# Patient Record
Sex: Male | Born: 1940 | Race: White | Hispanic: No | Marital: Married | Smoking: Never smoker
Health system: Southern US, Community
[De-identification: ages and names within clinical notes are randomized; demographics above are authoritative.]

## PROBLEM LIST (undated history)

## (undated) DIAGNOSIS — I10 Essential (primary) hypertension: Secondary | ICD-10-CM

---

## 2014-08-25 ENCOUNTER — Ambulatory Visit (HOSPITAL_COMMUNITY): Payer: BC Managed Care – PPO | Attending: Emergency Medicine

## 2014-08-25 ENCOUNTER — Encounter (HOSPITAL_COMMUNITY): Payer: Self-pay | Admitting: Emergency Medicine

## 2014-08-25 ENCOUNTER — Emergency Department (HOSPITAL_COMMUNITY)
Admission: EM | Admit: 2014-08-25 | Discharge: 2014-08-25 | Disposition: A | Discharge status: Home / Self Care | Payer: BC Managed Care – PPO | Source: Home / Self Care | Attending: Emergency Medicine | Admitting: Emergency Medicine

## 2014-08-25 DIAGNOSIS — Y9289 Other specified places as the place of occurrence of the external cause: Secondary | ICD-10-CM | POA: Insufficient documentation

## 2014-08-25 DIAGNOSIS — R0781 Pleurodynia: Secondary | ICD-10-CM | POA: Insufficient documentation

## 2014-08-25 DIAGNOSIS — R079 Chest pain, unspecified: Secondary | ICD-10-CM | POA: Diagnosis present

## 2014-08-25 DIAGNOSIS — S20212A Contusion of left front wall of thorax, initial encounter: Secondary | ICD-10-CM

## 2014-08-25 DIAGNOSIS — W1840XA Slipping, tripping and stumbling without falling, unspecified, initial encounter: Secondary | ICD-10-CM | POA: Insufficient documentation

## 2014-08-25 HISTORY — DX: Essential (primary) hypertension: I10

## 2014-08-25 MED ORDER — TRAMADOL HCL 50 MG PO TABS: 50.0000 mg | ORAL_TABLET | Freq: Two times a day (BID) | ORAL | Status: AC | PRN

## 2014-08-25 NOTE — ED Provider Notes (Signed)
Medical screening examination/treatment/procedure(s) were performed by non-physician practitioner and as supervising physician I was immediately available for consultation/collaboration.  Leslee Homeavid Traveon Louro, M.D.  Reuben Likesavid C Chelcey Caputo, MD 08/25/14 [phone]

## 2014-08-25 NOTE — ED Provider Notes (Signed)
CSN: [id number]     Arrival date & time 08/25/14  1042 History   First MD Initiated Contact with Patient 08/25/14 1104     Chief Complaint  Patient presents with  . Fall   (Consider location/radiation/quality/duration/timing/severity/associated sxs/prior Treatment) HPI Comments: Patient reports he was filling his car with gas on 08/22/2014 and as he turned around he accidentally tripped over gas pump hose and fell forward landing on his left side. Denies head trauma or LOC. States left lateral chest wall has remained sore since the fall and is particularly uncomfortable if coughs, sneezes or laughs. Denies dyspnea or hemoptysis.   Patient is a 73 y.o. male presenting with fall. The history is provided by the patient.  Fall    Past Medical History  Diagnosis Date  . Hypertension    History reviewed. No pertinent past surgical history. History reviewed. No pertinent family history. History  Substance Use Topics  . Smoking status: Never Smoker   . Smokeless tobacco: Not on file  . Alcohol Use: No    Review of Systems  All other systems reviewed and are negative.   Allergies  Review of patient's allergies indicates no known allergies.  Home Medications   Prior to Admission medications   Medication Sig Start Date End Date Taking? Authorizing Provider  Atorvastatin Calcium (LIPITOR PO) Take by mouth.   Yes Historical Provider, MD  Barium Sulfate (DIGITAL HD PO) Take by mouth.   Yes Historical Provider, MD  METOPROLOL TARTRATE PO Take by mouth.   Yes Historical Provider, MD  traMADol (ULTRAM) 50 MG tablet Take 1 tablet (50 mg total) by mouth every 12 (twelve) hours as needed for moderate pain or severe pain. 08/25/14   Jess BartersJennifer Lee H Presson, PA   BP 150/85  Pulse 100  Temp(Src) 98.5 F (36.9 C) (Oral)  Resp 16  SpO2 100% Physical Exam  Nursing note and vitals reviewed. Constitutional: He is oriented to person, place, and time. He appears well-developed and  well-nourished. No distress.  HENT:  Head: Normocephalic and atraumatic.  Eyes: Conjunctivae are normal.  Cardiovascular: Normal rate, regular rhythm and normal heart sounds.   Pulmonary/Chest: Effort normal and breath sounds normal. No respiratory distress. He exhibits tenderness.    Abdominal: Soft. Bowel sounds are normal. He exhibits no distension. There is no tenderness.  Neurological: He is alert and oriented to person, place, and time.  Skin: Skin is warm and dry. No rash noted. No erythema.  Psychiatric: He has a normal mood and affect. His behavior is normal.    ED Course  Procedures (including critical care time) Labs Review Labs Reviewed - No data to display  Imaging Review Dg Ribs Unilateral W/chest Left  08/25/2014   CLINICAL DATA:  Tripped at gas pump with left mid lower lateral chest and rib pain. Initial encounter.  EXAM: LEFT RIBS AND CHEST - 3+ VIEW  COMPARISON:  None.  FINDINGS: No fracture or other bone lesions are seen involving the ribs. There is no evidence of pneumothorax or pleural effusion. Both lungs are clear. Borderline cardiomegaly. Aortic tortuosity, accentuated by rotation.  IMPRESSION: No rib fracture, effusion, or pneumothorax.   Electronically Signed   By: Tiburcio PeaJonathan  Watts M.D.   On: 08/25/2014 12:57     MDM   1. Rib contusion, left, initial encounter   Films negative as above. Instructions given to patient as below.  Xrays normal. No broken ribs. Expect area to remain sore for next several weeks. Tylenol as directed on packaging  for mild to moderate pain and tramadol as prescribed for increased pain.  If you develop fever or shortness of breath, please have yourself re-evaluated.   Ria ClockJennifer Lee H Presson, [place]PA 08/25/14 1326

## 2014-08-25 NOTE — ED Notes (Signed)
pPt  George Ponce  About  3  Days  Ago  And  Injured  His l rib  Cage  Area  He  Has  Pain  When  he  Coughs  Moves and  Or  Takes  A  Deep  Breath   He  denys  Coughing       Any  Blood     - he  Has  However   Had  Some  Sneezing  From what  He     Describes  As  A  Virus

## 2014-08-25 NOTE — Discharge Instructions (Signed)
Xrays normal. No broken ribs. Expect area to remain sore for next several weeks. Tylenol as directed on packaging for mild to moderate pain and tramadol as prescribed for increased pain.  If you develop fever or shortness of breath, please have yourself re-evaluated.   Chest Contusion A chest contusion is a deep bruise on your chest area. Contusions are the result of an injury that caused bleeding under the skin. A chest contusion may involve bruising of the skin, muscles, or ribs. The contusion may turn blue, purple, or yellow. Minor injuries will give you a painless contusion, but more severe contusions may stay painful and swollen for a few weeks. CAUSES  A contusion is usually caused by a blow, trauma, or direct force to an area of the body. SYMPTOMS   Swelling and redness of the injured area.  Discoloration of the injured area.  Tenderness and soreness of the injured area.  Pain. DIAGNOSIS  The diagnosis can be made by taking a history and performing a physical exam. An X-ray, CT scan, or MRI may be needed to determine if there were any associated injuries, such as broken bones (fractures) or internal injuries. TREATMENT  Often, the best treatment for a chest contusion is resting, icing, and applying cold compresses to the injured area. Deep breathing exercises may be recommended to reduce the risk of pneumonia. Over-the-counter medicines may also be recommended for pain control. HOME CARE INSTRUCTIONS   Put ice on the injured area.  Put ice in a plastic bag.  Place a towel between your skin and the bag.  Leave the ice on for 15-20 minutes, 03-04 times a day.  Only take over-the-counter or prescription medicines as directed by your caregiver. Your caregiver may recommend avoiding anti-inflammatory medicines (aspirin, ibuprofen, and naproxen) for 48 hours because these medicines may increase bruising.  Rest the injured area.  Perform deep-breathing exercises as directed by your  caregiver.  Stop smoking if you smoke.  Do not lift objects over 5 pounds (2.3 kg) for 3 days or longer if recommended by your caregiver. SEEK IMMEDIATE MEDICAL CARE IF:   You have increased bruising or swelling.  You have pain that is getting worse.  You have difficulty breathing.  You have dizziness, weakness, or fainting.  You have blood in your urine or stool.  You cough up or vomit blood.  Your swelling or pain is not relieved with medicines. MAKE SURE YOU:   Understand these instructions.  Will watch your condition.  Will get help right away if you are not doing well or get worse. Document Released: 07/17/2001 Document Revised: 07/16/2012 Document Reviewed: 04/14/2012 [facility]ExitCare Patient Information 2015 [place], [place]. This information is not intended to replace advice given to you by your health care provider. Make sure you discuss any questions you have with your health care provider.  Rib Contusion A rib contusion (bruise) can occur by a blow to the chest or by a fall against a hard object. Usually these will be much better in a couple weeks. If X-rays were taken today and there are no broken bones (fractures), the diagnosis of bruising is made. However, broken ribs may not show up for several days, or may be discovered later on a routine X-ray when signs of healing show up. If this happens to you, it does not mean that something was missed on the X-ray, but simply that it did not show up on the first X-rays. Earlier diagnosis will not usually change the treatment. HOME CARE  INSTRUCTIONS   Avoid strenuous activity. Be careful during activities and avoid bumping the injured ribs. Activities that pull on the injured ribs and cause pain should be avoided, if possible.  For the first day or two, an ice pack used every 20 minutes while awake may be helpful. Put ice in a plastic bag and put a towel between the bag and the skin.  Eat a normal, well-balanced diet. Drink plenty  of fluids to avoid constipation.  Take deep breaths several times a day to keep lungs free of infection. Try to cough several times a day. Splint the injured area with a pillow while coughing to ease pain. Coughing can help prevent pneumonia.  Wear a rib belt or binder only if told to do so by your caregiver. If you are wearing a rib belt or binder, you must do the breathing exercises as directed by your caregiver. If not used properly, rib belts or binders restrict breathing which can lead to pneumonia.  Only take over-the-counter or prescription medicines for pain, discomfort, or fever as directed by your caregiver. SEEK MEDICAL CARE IF:   You or your child has an oral temperature above 102 F (38.9 C).  Your baby is older than 3 months with a rectal temperature of 100.5 F (38.1 C) or higher for more than 1 day.  You develop a cough, with thick or bloody sputum. SEEK IMMEDIATE MEDICAL CARE IF:   You have difficulty breathing.  You feel sick to your stomach (nausea), have vomiting or belly (abdominal) pain.  You have worsening pain, not controlled with medications, or there is a change in the location of the pain.  You develop sweating or radiation of the pain into the arms, jaw or shoulders, or become light headed or faint.  You or your child has an oral temperature above 102 F (38.9 C), not controlled by medicine.  Your or your baby is older than 3 months with a rectal temperature of 102 F (38.9 C) or higher.  Your baby is 343 months old or younger with a rectal temperature of 100.4 F (38 C) or higher. MAKE SURE YOU:   Understand these instructions.  Will watch your condition.  Will get help right away if you are not doing well or get worse. Document Released: 07/17/2001 Document Revised: 02/16/2013 Document Reviewed: 06/09/2008 [facility] CenterExitCare Patient Information 2015 [place], [place]. This information is not intended to replace advice given to you by your health care  provider. Make sure you discuss any questions you have with your health care provider.

## 2016-03-23 IMAGING — CR DG RIBS W/ CHEST 3+V*L*
3 series · 3 of 3 positions shown · non-contrast
Comparison: None.

CLINICAL DATA: Tripped at gas pump with left mid lower lateral
chest and rib pain. Initial encounter.

EXAM:
LEFT RIBS AND CHEST - 3+ VIEW

[w chest pa]
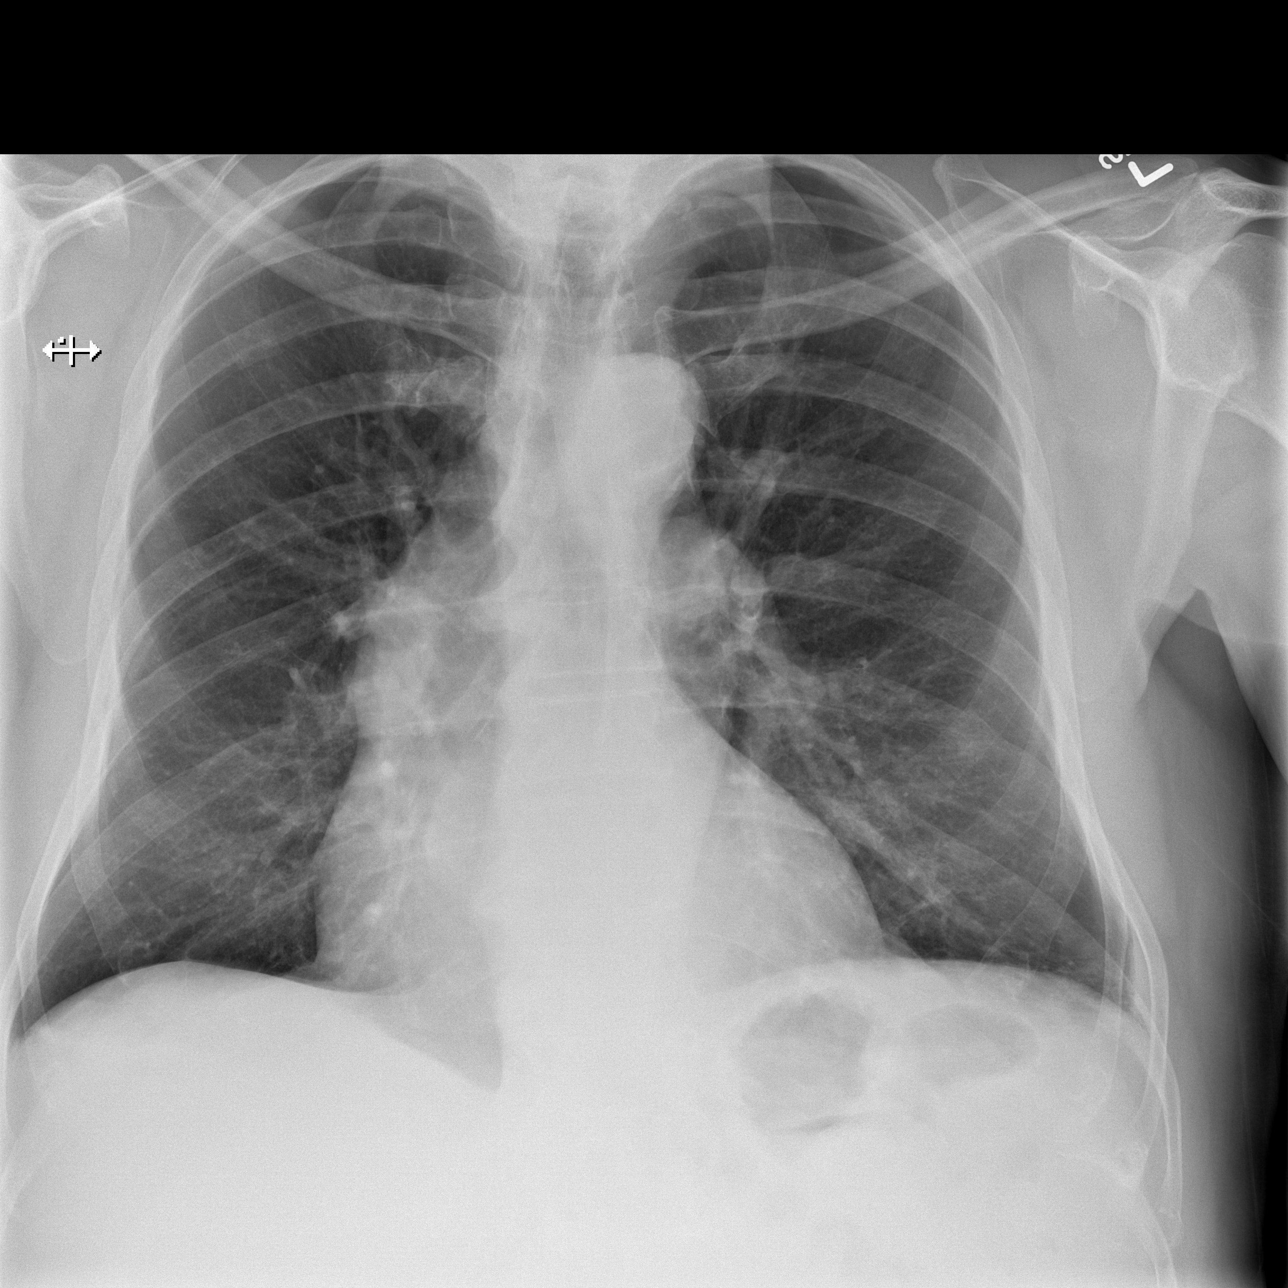

[w ribs ap lower left]
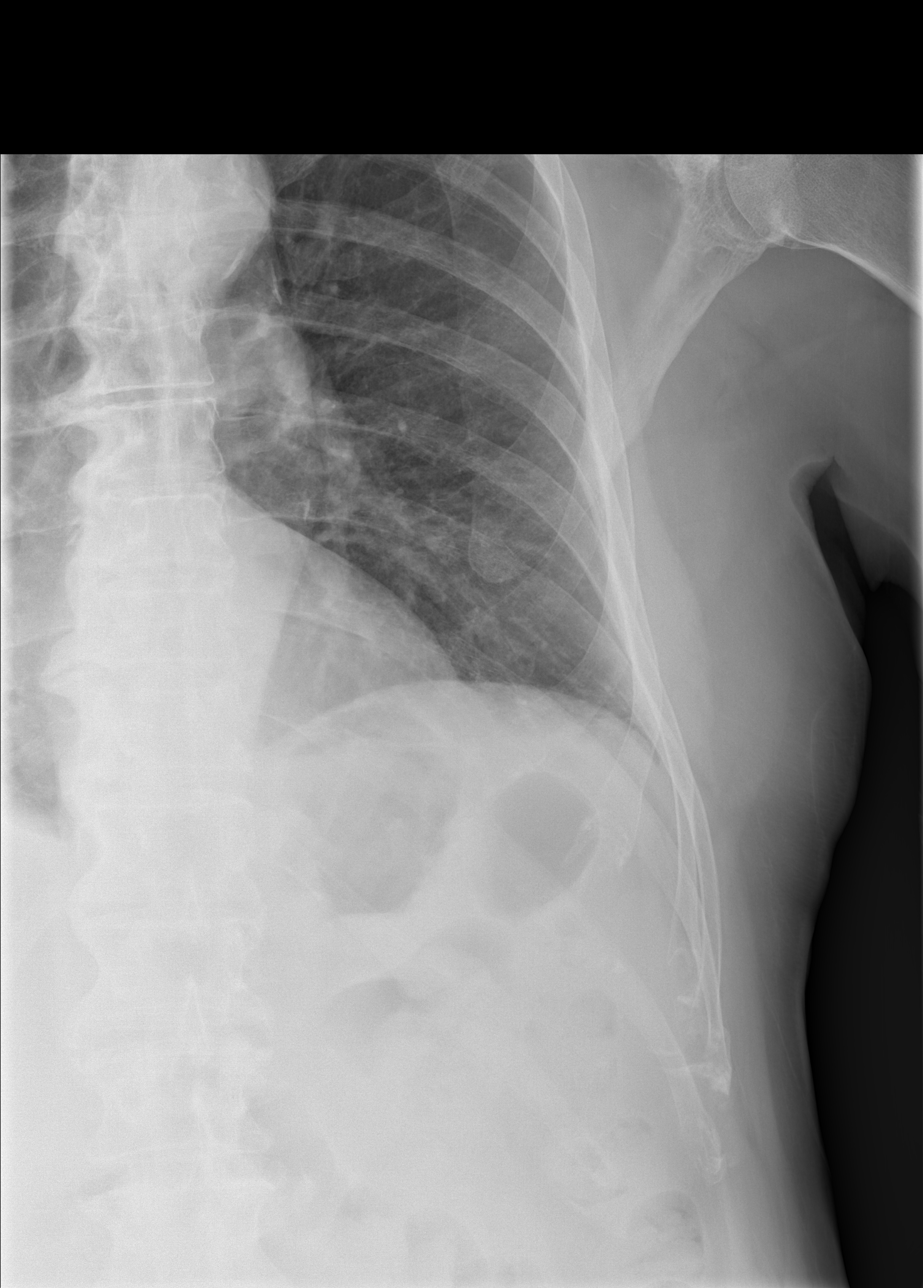

[w ribs obl left]
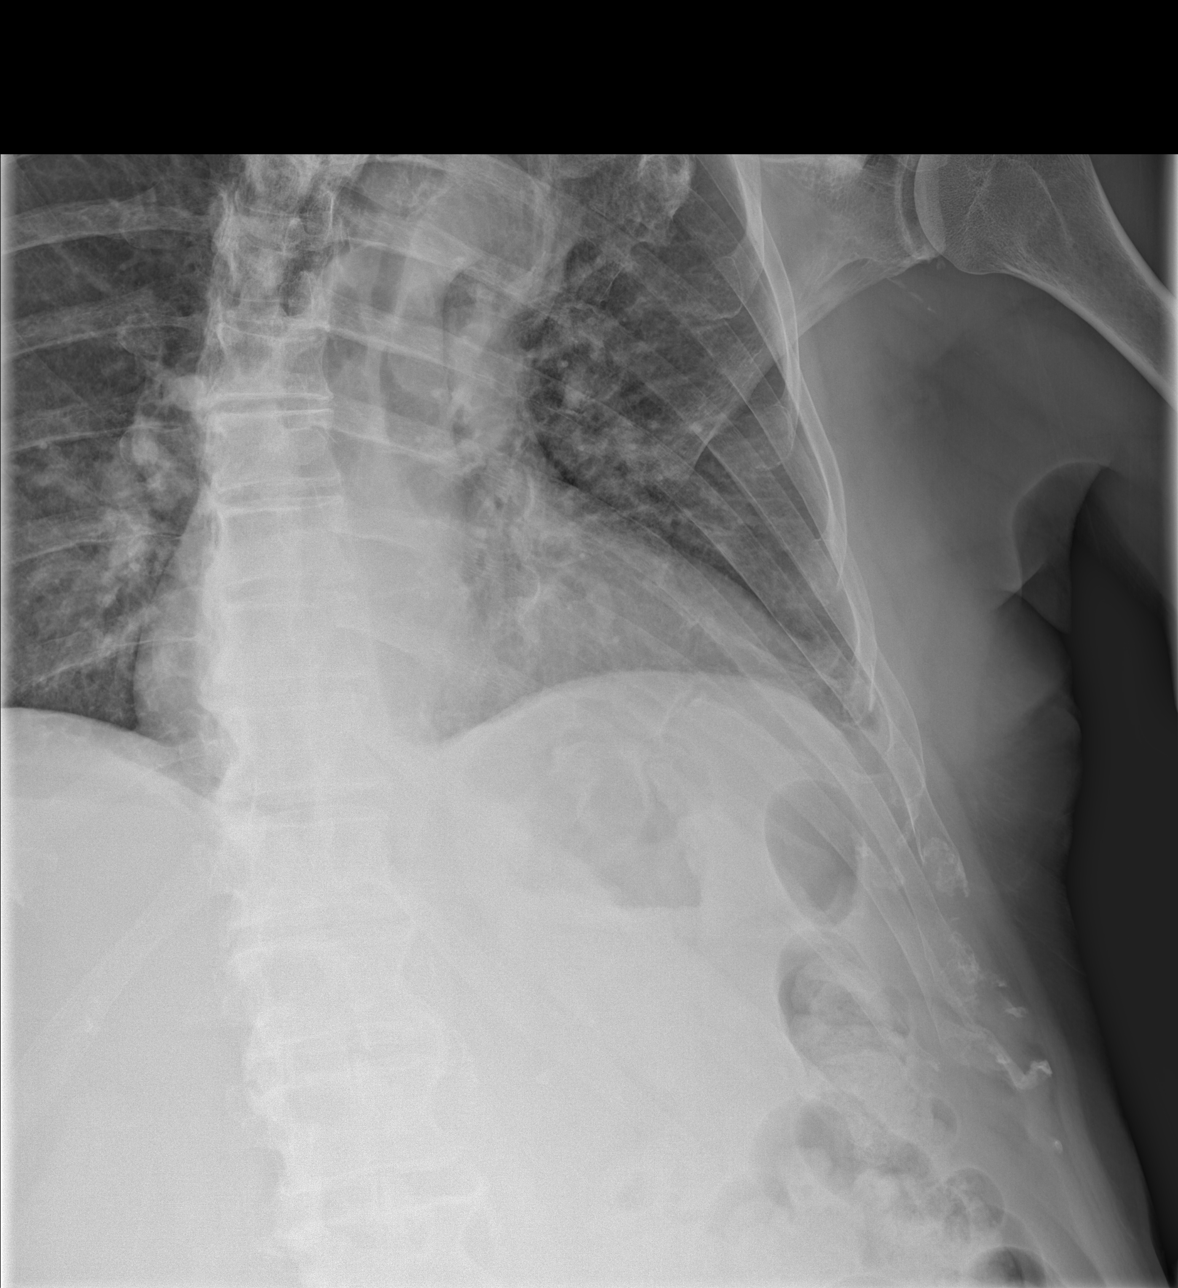

[3 of 3 positions shown; findings below may reference images not displayed]

FINDINGS: No fracture or other bone lesions are seen involving the ribs. There
is no evidence of pneumothorax or pleural effusion. Both lungs are
clear. Borderline cardiomegaly. Aortic tortuosity, accentuated by
rotation.
IMPRESSION: No rib fracture, effusion, or pneumothorax.

## 2016-10-05 DEATH — deceased
